# Patient Record
Sex: Male | Born: 1955 | Race: Black or African American | Hispanic: No | Marital: Married | Smoking: Never smoker
Health system: Southern US, Community
[De-identification: ages and names within clinical notes are randomized; demographics above are authoritative.]

---

## 2004-05-01 ENCOUNTER — Encounter: Admission: RE | Admit: 2004-05-01 | Discharge: 2004-05-01 | Payer: Self-pay | Admitting: Internal Medicine

## 2004-08-21 ENCOUNTER — Encounter: Admission: RE | Admit: 2004-08-21 | Discharge: 2004-09-05 | Payer: Self-pay | Admitting: Internal Medicine

## 2016-01-03 DIAGNOSIS — R739 Hyperglycemia, unspecified: Secondary | ICD-10-CM | POA: Diagnosis not present

## 2016-01-03 DIAGNOSIS — Z1389 Encounter for screening for other disorder: Secondary | ICD-10-CM | POA: Diagnosis not present

## 2016-01-03 DIAGNOSIS — R7303 Prediabetes: Secondary | ICD-10-CM | POA: Diagnosis not present

## 2016-01-03 DIAGNOSIS — Z0000 Encounter for general adult medical examination without abnormal findings: Secondary | ICD-10-CM | POA: Diagnosis not present

## 2016-01-08 DIAGNOSIS — H6123 Impacted cerumen, bilateral: Secondary | ICD-10-CM | POA: Diagnosis not present

## 2016-07-02 DIAGNOSIS — I10 Essential (primary) hypertension: Secondary | ICD-10-CM | POA: Diagnosis not present

## 2016-07-02 DIAGNOSIS — E7800 Pure hypercholesterolemia, unspecified: Secondary | ICD-10-CM | POA: Diagnosis not present

## 2016-07-02 DIAGNOSIS — K219 Gastro-esophageal reflux disease without esophagitis: Secondary | ICD-10-CM | POA: Diagnosis not present

## 2017-03-16 DIAGNOSIS — K219 Gastro-esophageal reflux disease without esophagitis: Secondary | ICD-10-CM | POA: Diagnosis not present

## 2017-03-16 DIAGNOSIS — Z1159 Encounter for screening for other viral diseases: Secondary | ICD-10-CM | POA: Diagnosis not present

## 2017-03-16 DIAGNOSIS — Z125 Encounter for screening for malignant neoplasm of prostate: Secondary | ICD-10-CM | POA: Diagnosis not present

## 2017-03-16 DIAGNOSIS — Z0000 Encounter for general adult medical examination without abnormal findings: Secondary | ICD-10-CM | POA: Diagnosis not present

## 2017-03-16 DIAGNOSIS — I10 Essential (primary) hypertension: Secondary | ICD-10-CM | POA: Diagnosis not present

## 2017-03-16 DIAGNOSIS — R7301 Impaired fasting glucose: Secondary | ICD-10-CM | POA: Diagnosis not present

## 2017-03-16 DIAGNOSIS — E7800 Pure hypercholesterolemia, unspecified: Secondary | ICD-10-CM | POA: Diagnosis not present

## 2017-04-02 DIAGNOSIS — H524 Presbyopia: Secondary | ICD-10-CM | POA: Diagnosis not present

## 2017-04-15 DIAGNOSIS — E876 Hypokalemia: Secondary | ICD-10-CM | POA: Diagnosis not present

## 2017-05-06 DIAGNOSIS — Z1210 Encounter for screening for malignant neoplasm of intestinal tract, unspecified: Secondary | ICD-10-CM | POA: Diagnosis not present

## 2017-06-12 DIAGNOSIS — D126 Benign neoplasm of colon, unspecified: Secondary | ICD-10-CM | POA: Diagnosis not present

## 2017-06-12 DIAGNOSIS — Z1211 Encounter for screening for malignant neoplasm of colon: Secondary | ICD-10-CM | POA: Diagnosis not present

## 2017-06-18 DIAGNOSIS — D126 Benign neoplasm of colon, unspecified: Secondary | ICD-10-CM | POA: Diagnosis not present

## 2017-09-17 DIAGNOSIS — I10 Essential (primary) hypertension: Secondary | ICD-10-CM | POA: Diagnosis not present

## 2017-09-17 DIAGNOSIS — Z1389 Encounter for screening for other disorder: Secondary | ICD-10-CM | POA: Diagnosis not present

## 2017-09-17 DIAGNOSIS — E7800 Pure hypercholesterolemia, unspecified: Secondary | ICD-10-CM | POA: Diagnosis not present

## 2017-09-17 DIAGNOSIS — R7303 Prediabetes: Secondary | ICD-10-CM | POA: Diagnosis not present

## 2018-01-14 ENCOUNTER — Other Ambulatory Visit: Payer: Self-pay

## 2018-01-14 ENCOUNTER — Ambulatory Visit (INDEPENDENT_AMBULATORY_CARE_PROVIDER_SITE_OTHER): Payer: BLUE CROSS/BLUE SHIELD

## 2018-01-14 ENCOUNTER — Encounter: Payer: Self-pay | Admitting: Podiatry

## 2018-01-14 ENCOUNTER — Other Ambulatory Visit: Payer: Self-pay | Admitting: Podiatry

## 2018-01-14 ENCOUNTER — Ambulatory Visit: Payer: BLUE CROSS/BLUE SHIELD | Admitting: Podiatry

## 2018-01-14 VITALS — BP 172/98 | HR 70

## 2018-01-14 DIAGNOSIS — M79671 Pain in right foot: Secondary | ICD-10-CM

## 2018-01-14 DIAGNOSIS — M779 Enthesopathy, unspecified: Secondary | ICD-10-CM

## 2018-01-14 DIAGNOSIS — M79672 Pain in left foot: Principal | ICD-10-CM

## 2018-01-14 DIAGNOSIS — M7752 Other enthesopathy of left foot: Secondary | ICD-10-CM | POA: Diagnosis not present

## 2018-01-14 DIAGNOSIS — M7751 Other enthesopathy of right foot: Secondary | ICD-10-CM | POA: Diagnosis not present

## 2018-01-14 MED ORDER — TRIAMCINOLONE ACETONIDE 10 MG/ML IJ SUSP
10.0000 mg | Freq: Once | INTRAMUSCULAR | Status: AC
  Administered 2018-01-14: 10 mg

## 2018-01-14 MED ORDER — DICLOFENAC SODIUM 75 MG PO TBEC: 75.0000 mg | DELAYED_RELEASE_TABLET | Freq: Two times a day (BID) | ORAL | 2 refills | Status: AC

## 2018-01-14 NOTE — Patient Instructions (Signed)

## 2018-01-15 NOTE — Progress Notes (Signed)
Subjective:   Patient ID: Christopher Barnes, male   DOB: 62 y.o.   MRN: [id number]   HPI Patient presents stating he has had a lot of pain on the inside of the left ankle for the last several months and does not remember specific injury.  Patient states that he does walk on cement floors and is on his foot all day and also patient does not smoke and likes to be active   Review of Systems  All other systems reviewed and are negative.       Objective:  Physical Exam  Constitutional: He appears well-developed and well-nourished.  Cardiovascular: Intact distal pulses.  Pulmonary/Chest: Effort normal.  Musculoskeletal: Normal range of motion.  Neurological: He is alert.  Skin: Skin is warm.  Nursing note and vitals reviewed.   Neurovascular status found to be intact muscle strength is adequate range of motion within normal limits with patient found to have quite a bit of discomfort in the medial ankle left with inflammation fluid around the posterior tibial insertion left.  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  At this point I went ahead and I did a sheath injection left 3 mg Kenalog 5 mg Xylocaine and I applied a fascial brace to lift the arch and discussed long-term orthotics.  I placed on diclofenac 75 mg twice daily and patient will be seen back to recheck  X-ray indicates moderate depression of the arch with no indications of pathology

## 2018-01-21 ENCOUNTER — Ambulatory Visit: Payer: BLUE CROSS/BLUE SHIELD | Admitting: Podiatry

## 2018-01-21 ENCOUNTER — Encounter: Payer: Self-pay | Admitting: Podiatry

## 2018-01-21 DIAGNOSIS — M79672 Pain in left foot: Secondary | ICD-10-CM | POA: Diagnosis not present

## 2018-01-21 DIAGNOSIS — M779 Enthesopathy, unspecified: Secondary | ICD-10-CM

## 2018-01-21 DIAGNOSIS — M79671 Pain in right foot: Secondary | ICD-10-CM

## 2018-01-24 NOTE — Progress Notes (Signed)
Subjective:   Patient ID: Christopher Barnes, male   DOB: 62 y.o.   MRN: [id number]   HPI Patient presents with chronic discomfort in the left plantar fascia and into the posterior tibial tendon and states it is improved but present with moderate depression of the arch and has had orthotics in the past which have lost their ability to hold his arch properly   ROS      Objective:  Physical Exam  Neurovascular status intact with continued inflammation of the posterior tibial tendon insertion left with improvement from previous visit and significant depression of the arch noted bilateral     Assessment:  Inflammatory condition with structural changes of the arch contributory to the problem     Plan:  H&P condition reviewed and at this point recommended orthotics to lift up the arch and take pressure off along with ice therapy and supportive shoes.  Patient is casted for functional orthotics and we will lift up the medial side and will be seen by ped orthotist for dispensing

## 2018-02-07 DIAGNOSIS — B001 Herpesviral vesicular dermatitis: Secondary | ICD-10-CM | POA: Diagnosis not present

## 2018-02-07 DIAGNOSIS — I10 Essential (primary) hypertension: Secondary | ICD-10-CM | POA: Diagnosis not present

## 2018-02-16 ENCOUNTER — Ambulatory Visit: Payer: BLUE CROSS/BLUE SHIELD | Admitting: Orthotics

## 2018-02-16 DIAGNOSIS — M779 Enthesopathy, unspecified: Secondary | ICD-10-CM

## 2018-02-26 NOTE — Progress Notes (Signed)
Patient came in today to pick up custom made foot orthotics.  The goals were accomplished and the patient reported no dissatisfaction with said orthotics.  Patient was advised of breakin period and how to report any issues. 

## 2018-03-24 DIAGNOSIS — Z0000 Encounter for general adult medical examination without abnormal findings: Secondary | ICD-10-CM | POA: Diagnosis not present

## 2018-03-24 DIAGNOSIS — Z1389 Encounter for screening for other disorder: Secondary | ICD-10-CM | POA: Diagnosis not present

## 2018-03-24 DIAGNOSIS — I10 Essential (primary) hypertension: Secondary | ICD-10-CM | POA: Diagnosis not present

## 2018-03-24 DIAGNOSIS — R7303 Prediabetes: Secondary | ICD-10-CM | POA: Diagnosis not present

## 2018-03-24 DIAGNOSIS — E7800 Pure hypercholesterolemia, unspecified: Secondary | ICD-10-CM | POA: Diagnosis not present

## 2018-09-28 DIAGNOSIS — E7800 Pure hypercholesterolemia, unspecified: Secondary | ICD-10-CM | POA: Diagnosis not present

## 2018-09-28 DIAGNOSIS — E876 Hypokalemia: Secondary | ICD-10-CM | POA: Diagnosis not present

## 2018-09-28 DIAGNOSIS — R7303 Prediabetes: Secondary | ICD-10-CM | POA: Diagnosis not present

## 2018-09-28 DIAGNOSIS — I10 Essential (primary) hypertension: Secondary | ICD-10-CM | POA: Diagnosis not present

## 2019-01-25 DIAGNOSIS — H524 Presbyopia: Secondary | ICD-10-CM | POA: Diagnosis not present

## 2019-02-14 DIAGNOSIS — H43812 Vitreous degeneration, left eye: Secondary | ICD-10-CM | POA: Diagnosis not present

## 2019-03-30 DIAGNOSIS — R7303 Prediabetes: Secondary | ICD-10-CM | POA: Diagnosis not present

## 2019-03-30 DIAGNOSIS — Z0000 Encounter for general adult medical examination without abnormal findings: Secondary | ICD-10-CM | POA: Diagnosis not present

## 2019-03-30 DIAGNOSIS — Z125 Encounter for screening for malignant neoplasm of prostate: Secondary | ICD-10-CM | POA: Diagnosis not present

## 2019-03-30 DIAGNOSIS — Z1389 Encounter for screening for other disorder: Secondary | ICD-10-CM | POA: Diagnosis not present

## 2019-03-30 DIAGNOSIS — Z23 Encounter for immunization: Secondary | ICD-10-CM | POA: Diagnosis not present

## 2019-03-30 DIAGNOSIS — E7800 Pure hypercholesterolemia, unspecified: Secondary | ICD-10-CM | POA: Diagnosis not present

## 2019-03-30 DIAGNOSIS — I10 Essential (primary) hypertension: Secondary | ICD-10-CM | POA: Diagnosis not present

## 2019-04-01 DIAGNOSIS — H43812 Vitreous degeneration, left eye: Secondary | ICD-10-CM | POA: Diagnosis not present

## 2019-04-26 ENCOUNTER — Emergency Department (HOSPITAL_COMMUNITY): Payer: BC Managed Care – PPO

## 2019-04-26 ENCOUNTER — Emergency Department (HOSPITAL_COMMUNITY)
Admission: EM | Admit: 2019-04-26 | Discharge: 2019-04-26 | Disposition: A | Discharge status: Home / Self Care | Payer: BC Managed Care – PPO | Attending: Emergency Medicine | Admitting: Emergency Medicine

## 2019-04-26 ENCOUNTER — Other Ambulatory Visit: Payer: Self-pay

## 2019-04-26 DIAGNOSIS — E237 Disorder of pituitary gland, unspecified: Secondary | ICD-10-CM | POA: Diagnosis not present

## 2019-04-26 DIAGNOSIS — M542 Cervicalgia: Secondary | ICD-10-CM | POA: Diagnosis not present

## 2019-04-26 DIAGNOSIS — E236 Other disorders of pituitary gland: Secondary | ICD-10-CM | POA: Diagnosis not present

## 2019-04-26 DIAGNOSIS — R51 Headache: Secondary | ICD-10-CM | POA: Diagnosis not present

## 2019-04-26 DIAGNOSIS — R519 Headache, unspecified: Secondary | ICD-10-CM

## 2019-04-26 DIAGNOSIS — Z79899 Other long term (current) drug therapy: Secondary | ICD-10-CM | POA: Insufficient documentation

## 2019-04-26 LAB — CBC WITH DIFFERENTIAL/PLATELET
Abs Immature Granulocytes: 0.01 10*3/uL (ref 0.00–0.07)
Basophils Absolute: 0 10*3/uL (ref 0.0–0.1)
Basophils Relative: 1 %
Eosinophils Absolute: 0.2 10*3/uL (ref 0.0–0.5)
Eosinophils Relative: 4 %
HCT: 49.5 % (ref 39.0–52.0)
Hemoglobin: 16 g/dL (ref 13.0–17.0)
Immature Granulocytes: 0 %
Lymphocytes Relative: 14 %
Lymphs Abs: 0.7 10*3/uL (ref 0.7–4.0)
MCH: 27.9 pg (ref 26.0–34.0)
MCHC: 32.3 g/dL (ref 30.0–36.0)
MCV: 86.2 fL (ref 80.0–100.0)
Monocytes Absolute: 0.2 10*3/uL (ref 0.1–1.0)
Monocytes Relative: 4 %
Neutro Abs: 3.8 10*3/uL (ref 1.7–7.7)
Neutrophils Relative %: 77 %
Platelets: 314 10*3/uL (ref 150–400)
RBC: 5.74 MIL/uL (ref 4.22–5.81)
RDW: 13.1 % (ref 11.5–15.5)
WBC: 4.9 10*3/uL (ref 4.0–10.5)
nRBC: 0 % (ref 0.0–0.2)

## 2019-04-26 LAB — COMPREHENSIVE METABOLIC PANEL
ALT: 40 U/L (ref 0–44)
AST: 39 U/L (ref 15–41)
Albumin: 4.4 g/dL (ref 3.5–5.0)
Alkaline Phosphatase: 66 U/L (ref 38–126)
Anion gap: 12 (ref 5–15)
BUN: 18 mg/dL (ref 8–23)
CO2: 19 mmol/L — ABNORMAL LOW (ref 22–32)
Calcium: 9.5 mg/dL (ref 8.9–10.3)
Chloride: 112 mmol/L — ABNORMAL HIGH (ref 98–111)
Creatinine, Ser: 1.09 mg/dL (ref 0.61–1.24)
GFR calc Af Amer: >60 mL/min (ref >60)
GFR calc non Af Amer: >60 mL/min (ref >60)
Glucose, Bld: 132 mg/dL — ABNORMAL HIGH (ref 70–99)
Potassium: 3.7 mmol/L (ref 3.5–5.1)
Sodium: 143 mmol/L (ref 135–145)
Total Bilirubin: 0.7 mg/dL (ref 0.3–1.2)
Total Protein: 7.8 g/dL (ref 6.5–8.1)

## 2019-04-26 LAB — PROTIME-INR
INR: 1 (ref 0.8–1.2)
Prothrombin Time: 13.2 seconds (ref 11.4–15.2)

## 2019-04-26 MED ORDER — PROCHLORPERAZINE EDISYLATE 10 MG/2ML IJ SOLN
10.0000 mg | Freq: Once | INTRAMUSCULAR | Status: AC
  Administered 2019-04-26: 10 mg via INTRAVENOUS
  Filled 2019-04-26: qty 2

## 2019-04-26 MED ORDER — SODIUM CHLORIDE 0.9 % IV BOLUS
1000.0000 mL | Freq: Once | INTRAVENOUS | Status: AC
  Administered 2019-04-26: 1000 mL via INTRAVENOUS

## 2019-04-26 MED ORDER — IOHEXOL 350 MG/ML SOLN
75.0000 mL | Freq: Once | INTRAVENOUS | Status: AC | PRN
  Administered 2019-04-26: 10:00:00 75 mL via INTRAVENOUS

## 2019-04-26 MED ORDER — DIPHENHYDRAMINE HCL 50 MG/ML IJ SOLN
25.0000 mg | Freq: Once | INTRAMUSCULAR | Status: AC
Start: 2019-04-26 — End: 2019-04-26
  Administered 2019-04-26: 25 mg via INTRAVENOUS
  Filled 2019-04-26: qty 1

## 2019-04-26 MED ORDER — GADOBUTROL 1 MMOL/ML IV SOLN
7.0000 mL | Freq: Once | INTRAVENOUS | Status: AC | PRN
  Administered 2019-04-26: 7 mL via INTRAVENOUS

## 2019-04-26 MED ORDER — CYCLOBENZAPRINE HCL 10 MG PO TABS: 10.0000 mg | ORAL_TABLET | Freq: Two times a day (BID) | ORAL | 0 refills | Status: AC | PRN

## 2019-04-26 NOTE — ED Triage Notes (Signed)
Patient reports headache onset this morning, no relief with ibuprofen. Denies N/V, vision changes, photosensitivity. NAD noted.

## 2019-04-26 NOTE — Discharge Instructions (Signed)
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  I do not think it was the cause of your headache today, I suspect it was more related to muscle pain and spasm in your neck however, as we discovered, neurosurgery request follow-up with you in clinic.  Please stay hydrated and rest.  If any symptoms change or worsen acutely including developing new neurologic deficits, please return to the nearest emergency department.

## 2019-04-26 NOTE — ED Provider Notes (Signed)
[facility] EMERGENCY DEPARTMENT Provider Note   CSN: [id number] Arrival date & time: 04/26/19  [id number]     History   Chief Complaint Chief Complaint  Patient presents with   Headache    HPI Christopher Barnes is a 63 y.o. male.     The history is provided by medical records, the patient and the spouse. No language interpreter was used.  Headache Pain location:  R parietal and occipital Quality:  Dull Radiates to:  R neck Severity currently:  8/10 Severity at highest:  10/10 Onset quality:  Gradual Duration:  5 hours Timing:  Constant Progression:  Waxing and waning Chronicity:  New Similar to prior headaches: no   Context: loud noise   Relieved by:  Nothing Worsened by:  Nothing Ineffective treatments:  NSAIDs Associated symptoms: neck pain   Associated symptoms: no abdominal pain, no back pain, no blurred vision, no congestion, no cough, no diarrhea, no dizziness, no eye pain, no fatigue, no fever, no focal weakness, no hearing loss, no loss of balance, no nausea, no near-syncope, no neck stiffness, no numbness, no photophobia, no seizures, no URI, no visual change, no vomiting and no weakness     No past medical history on file.  There are no active problems to display for this patient.   No past surgical history on file.      Home Medications    Prior to Admission medications   Medication Sig Start Date End Date Taking? Authorizing Provider  diclofenac (VOLTAREN) 75 MG EC tablet Take 1 tablet (75 mg total) by mouth 2 (two) times daily. 01/14/18   Wallene Huh, DPM  diltiazem (TIAZAC) 240 MG 24 hr capsule TK 1 C PO TWICE A DAY 12/01/17   [provider]  fosinopril-hydrochlorothiazide (MONOPRIL-HCT) 10-12.5 MG tablet TK 1 T PO D ONCE A DAY 12/01/17   [provider]  Potassium Chloride CR (MICRO-K) 8 MEQ CPCR capsule CR Take 20 mEq by mouth.    [provider]  simvastatin (ZOCOR) 20 MG tablet TK 1 T PO IN THE EVENING  ONCE A DAY 12/01/17   [provider]  triamcinolone cream (KENALOG) 0.1 % APPLY SPARINGLY TWICE DAILY 10/29/17   [provider]    Family History No family history on file.  Social History Social History   Tobacco Use   Smoking status: Never Smoker   Smokeless tobacco: Never Used  Substance Use Topics   Alcohol use: Not on file   Drug use: Not on file     Allergies   Patient has no known allergies.   Review of Systems Review of Systems  Constitutional: Negative for chills, diaphoresis, fatigue and fever.  HENT: Negative for congestion and hearing loss.   Eyes: Negative for blurred vision, photophobia, pain and visual disturbance.  Respiratory: Negative for cough, chest tightness, shortness of breath, wheezing and stridor.   Cardiovascular: Negative for chest pain, palpitations, leg swelling and near-syncope.  Gastrointestinal: Negative for abdominal pain, constipation, diarrhea, nausea and vomiting.  Genitourinary: Negative for flank pain and frequency.  Musculoskeletal: Positive for neck pain. Negative for back pain and neck stiffness.  Skin: Negative for rash and wound.  Neurological: Positive for headaches. Negative for dizziness, focal weakness, seizures, facial asymmetry, weakness, light-headedness, numbness and loss of balance.  Psychiatric/Behavioral: Negative for agitation and confusion.  All other systems reviewed and are negative.    Physical Exam Updated Vital Signs BP (!) 145/73 (BP Location: Right Arm)  Pulse 62    Temp 98.2 F (36.8 C) (Oral)    Resp 16    SpO2 98%   Physical Exam Vitals signs and nursing note reviewed.  Constitutional:      General: He is not in acute distress.    Appearance: He is well-developed. He is not ill-appearing, toxic-appearing or diaphoretic.  HENT:     Head: Normocephalic and atraumatic.  Eyes:     General: No visual field deficit or scleral icterus.    Extraocular Movements: Extraocular  movements intact.     Conjunctiva/sclera: Conjunctivae normal.     Pupils: Pupils are equal, round, and reactive to light. Pupils are equal.  Neck:     Musculoskeletal: Normal range of motion and neck supple. No neck rigidity.  Cardiovascular:     Rate and Rhythm: Normal rate and regular rhythm.     Heart sounds: Normal heart sounds. No murmur.  Pulmonary:     Effort: Pulmonary effort is normal. No respiratory distress.     Breath sounds: Normal breath sounds.  Abdominal:     Palpations: Abdomen is soft.     Tenderness: There is no abdominal tenderness.  Musculoskeletal: Normal range of motion.        General: No swelling or tenderness.  Skin:    General: Skin is warm and dry.     Capillary Refill: Capillary refill takes less than 2 seconds.  Neurological:     Mental Status: He is alert and oriented to person, place, and time.     GCS: GCS eye subscore is 4. GCS verbal subscore is 5. GCS motor subscore is 6.     Cranial Nerves: No cranial nerve deficit, dysarthria or facial asymmetry.     Sensory: No sensory deficit.     Motor: No weakness.     Coordination: Romberg sign negative. Coordination normal.     Gait: Gait normal.     Deep Tendon Reflexes: Reflexes normal.  Psychiatric:        Mood and Affect: Mood normal.      ED Treatments / Results  Labs (all labs ordered are listed, but only abnormal results are displayed) Labs Reviewed  COMPREHENSIVE METABOLIC PANEL - Abnormal; Notable for the following components:      Result Value   Chloride 112 (*)    CO2 19 (*)    Glucose, Bld 132 (*)    All other components within normal limits  CBC WITH DIFFERENTIAL/PLATELET  PROTIME-INR    EKG None  Radiology Ct Angio Head W Or Wo Contrast  Result Date: 04/26/2019 CLINICAL DATA:  Sudden onset new type headache. Right neck pain. Rule out dissection. EXAM: CT ANGIOGRAPHY HEAD AND NECK TECHNIQUE: Multidetector CT imaging of the head and neck was performed using the standard  protocol during bolus administration of intravenous contrast. Multiplanar CT image reconstructions and MIPs were obtained to evaluate the vascular anatomy. Carotid stenosis measurements (when applicable) are obtained utilizing NASCET criteria, using the distal internal carotid diameter as the denominator. CONTRAST:  64mL OMNIPAQUE IOHEXOL 350 MG/ML SOLN COMPARISON:  None. FINDINGS: CT HEAD FINDINGS Brain: The sella is enlarged. Pituitary mass is present measuring 15 mm in height. No definite hemorrhage in the pituitary. Probable mild compression of the optic chiasm Ventricle size normal. Negative for acute or chronic infarct. No acute hemorrhage. Vascular: Negative for hyperdense vessel Skull: Negative Sinuses: Negative Orbits: Negative Review of the MIP images confirms the above findings CTA NECK FINDINGS Aortic arch: Standard branching. Imaged portion shows  no evidence of aneurysm or dissection. No significant stenosis of the major arch vessel origins. Right carotid system: Normal right carotid. Negative for stenosis or dissection Left carotid system: Normal left carotid. Negative for stenosis or dissection. Vertebral arteries: Both vertebral arteries are normal and patent to the basilar. Skeleton: Negative Other neck: None Upper chest: Lung apices clear bilaterally. Review of the MIP images confirms the above findings CTA HEAD FINDINGS Anterior circulation: Cavernous carotid widely patent. Anterior and middle cerebral arteries widely patent without stenosis. Negative for aneurysm Posterior circulation: Both vertebral arteries patent to the basilar. Right PICA patent. Left PICA not visualized left AICA may supply this territory. Basilar widely patent. Superior cerebellar and posterior cerebral arteries patent bilaterally without stenosis. Negative for aneurysm. Venous sinuses: Normal venous enhancement Anatomic variants: None Review of the MIP images confirms the above findings IMPRESSION: 1. 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 Suprasellar extension with possible mild compression of the optic chiasm. No evidence of pituitary hemorrhage. Recommend follow-up MRI brain without and with contrast to further evaluate. 2. Negative for acute infarct 3. Negative CTA head neck. No dissection or aneurysm identified. No significant arterial stenosis. Electronically Signed   By: Franchot Gallo M.D.   On: 04/26/2019 10:08   Ct Angio Neck W And/or Wo Contrast  Result Date: 04/26/2019 CLINICAL DATA:  Sudden onset new type headache. Right neck pain. Rule out dissection. EXAM: CT ANGIOGRAPHY HEAD AND NECK TECHNIQUE: Multidetector CT imaging of the head and neck was performed using the standard protocol during bolus administration of intravenous contrast. Multiplanar CT image reconstructions and MIPs were obtained to evaluate the vascular anatomy. Carotid stenosis measurements (when applicable) are obtained utilizing NASCET criteria, using the distal internal carotid diameter as the denominator. CONTRAST:  70mL OMNIPAQUE IOHEXOL 350 MG/ML SOLN COMPARISON:  None. FINDINGS: CT HEAD FINDINGS Brain: The sella is enlarged. Pituitary mass is present measuring 15 mm in height. No definite hemorrhage in the pituitary. Probable mild compression of the optic chiasm Ventricle size normal. Negative for acute or chronic infarct. No acute hemorrhage. Vascular: Negative for hyperdense vessel Skull: Negative Sinuses: Negative Orbits: Negative Review of the MIP images confirms the above findings CTA NECK FINDINGS Aortic arch: Standard branching. Imaged portion shows no evidence of aneurysm or dissection. No significant stenosis of the major arch vessel origins. Right carotid system: Normal right carotid. Negative for stenosis or dissection Left carotid system: Normal left carotid. Negative for stenosis or dissection. Vertebral arteries: Both vertebral arteries are normal and patent to the basilar. Skeleton: Negative Other neck: None Upper  chest: Lung apices clear bilaterally. Review of the MIP images confirms the above findings CTA HEAD FINDINGS Anterior circulation: Cavernous carotid widely patent. Anterior and middle cerebral arteries widely patent without stenosis. Negative for aneurysm Posterior circulation: Both vertebral arteries patent to the basilar. Right PICA patent. Left PICA not visualized left AICA may supply this territory. Basilar widely patent. Superior cerebellar and posterior cerebral arteries patent bilaterally without stenosis. Negative for aneurysm. Venous sinuses: Normal venous enhancement Anatomic variants: None Review of the MIP images confirms the above findings IMPRESSION: 1. 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 Suprasellar extension with possible mild compression of the optic chiasm. No evidence of pituitary hemorrhage. Recommend follow-up MRI brain without and with contrast to further evaluate. 2. Negative for acute infarct 3. Negative CTA head neck. No dissection or aneurysm identified. No significant arterial stenosis. Electronically Signed   By: Franchot Gallo M.D.   On: 04/26/2019 10:08  Mr Jeri Cos And Wo Contrast  Result Date: 04/26/2019 CLINICAL DATA:  Severe headache.  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 EXAM: MRI HEAD WITHOUT AND WITH CONTRAST TECHNIQUE: Multiplanar, multiecho pulse sequences of the brain and surrounding structures were obtained without and with intravenous contrast. CONTRAST:  20mL GADAVIST GADOBUTROL 1 MMOL/ML IV SOLN COMPARISON:  CTA head 04/26/2019 FINDINGS: Brain: Pituitary protocol. 15 mm mass within the sella. The sella is enlarged. The mass does not enhance on dynamic or delayed scanning. The mass is isointense to brain. No evidence of hemorrhage in the mass. The mass extends into the suprasellar cistern with superior displacement of the optic chiasm. Mild extension into the right cavernous sinus. Ventricle size normal. No acute infarct. No significant chronic ischemia or hemorrhage  Motion degraded study particularly on the postcontrast images Vascular: Normal arterial flow voids Skull and upper cervical spine: Negative Sinuses/Orbits: Mild mucosal edema paranasal sinuses.  Normal orbit Other: None IMPRESSION: 15 mm nonenhancing mass in the sella. Mass extends into the suprasellar cistern with mild flattening and up lifting of the optic chiasm. Mild extension into the right cavernous sinus. No evidence of hemorrhage into the mass. 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 Rathke's cleft cyst also possible. Craniopharyngioma not considered likely due to lack of enhancement and lack of calcification. Otherwise negative brain. Electronically Signed   By: Franchot Gallo M.D.   On: 04/26/2019 13:04    Procedures Procedures (including critical care time)  Medications Ordered in ED Medications  sodium chloride 0.9 % bolus 1,000 mL (0 mLs Intravenous Stopped 04/26/19 1212)  prochlorperazine (COMPAZINE) injection 10 mg (10 mg Intravenous Given 04/26/19 0833)  diphenhydrAMINE (BENADRYL) injection 25 mg (25 mg Intravenous Given 04/26/19 0833)  iohexol (OMNIPAQUE) 350 MG/ML injection 75 mL (75 mLs Intravenous Contrast Given 04/26/19 0942)  gadobutrol (GADAVIST) 1 MMOL/ML injection 7 mL (7 mLs Intravenous Contrast Given 04/26/19 1150)     Initial Impression / Assessment and Plan / ED Course  I have reviewed the triage vital signs and the nursing notes.  Pertinent labs & imaging results that were available during my care of the patient were reviewed by me and considered in my medical decision making (see chart for details).        SHERAZ SACHAR is a 63 y.o. male with a past medical history sniffing for hypertension who presents with new headache.  He reports that he was working outside yesterday but did not have any headache yesterday.  He reports this morning he woke up around 130 started having headache that intensified throughout the early  morning.  He was unable to go back to sleep.  He describes the pain is from his right lateral neck into his right head.  He describes it as severe between 7 out of 10-10 out of 10 at times.  He denies nausea, vomiting, vision changes.  Denies numbness, tingling, weakness of extremities.  He denies any new vision changes.  He denies any recent traumatic injuries.  No history of dissections, intracranial masses, or intracranial bleeding.  He does not take blood thinners.  He reports he does not have a history of headaches like this and is concerned.  He does report some mild phonophobia but no reported photophobia.  No other complaints on arrival.  No recent fevers, chills, or neck stiffness.  On exam, patient has no nuchal rigidity.  His lungs are clear and chest is nontender.  Abdomen is nontender.  No focal neurologic deficits  on my initial exam.  Good gait.  Normal finger-nose-finger testing bilaterally.  Clear speech.  Pupils are symmetric and reactive with normal visual fields.  Unremarkable neurologic exam.  Mild tenderness in the right lateral neck and head but no temporal tenderness to suggest temporal arteritis.  Had a shared decision-making conversation with patient and family and due to his new headache which he is never had before, the pain going into the neck, and to the right-sided head, we agreed on getting a CT of the head neck to look for dissection but continue to treat him as if he has a more benign headache with a headache cocktail and fluids.  With his lack of fevers, chills, or neck stiffness, low suspicion for meningitis at this time.  If work-up is complete reassuring, dissipate discharge home when headache is resolved.      10:24 AM Patient reports his headache is somewhat improved.  CT returned showing no evidence of dissection or aneurysm but does show evidence of possible pituitary tumor that is 1.5 cm.  MRI with and without contrast recommended further evaluate.  Patient agrees  with getting this imaging today given this new headache.  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  Neurosurgery was called who recommends follow-up in clinic.  No acute interventions needed.  Patient reports headache is improving still has muscle pain and spasm in his right neck.  He would like medication help.  Patient given prescription for muscle relaxant and will follow-up with PCP.  He will also follow-up with neurosurgery.  He understood return precautions and follow-up instructions.  No neurologic deficits and headache improved.  Patient discharged in good condition.     Final Clinical Impressions(s) / ED Diagnoses   Final diagnoses:  Pituitary mass ([place])  Acute nonintractable headache, unspecified headache type  Muscle pain, cervical    ED Discharge Orders         Ordered    cyclobenzaprine (FLEXERIL) 10 MG tablet  2 times daily PRN     04/26/19 1436          Clinical Impression: 1. Pituitary mass ([place])   2. Acute nonintractable headache, unspecified headache type   3. Muscle pain, cervical     Disposition: Discharge  Condition: Good  I have discussed the results, Dx and Tx plan with the pt(& family if present). He/she/they expressed understanding and agree(s) with the plan. Discharge instructions discussed at great length. Strict return precautions discussed and pt &/or family have verbalized understanding of the instructions. No further questions at time of discharge.    Discharge Medication List as of 04/26/2019  2:38 PM    START taking these medications   Details  cyclobenzaprine (FLEXERIL) 10 MG tablet Take 1 tablet (10 mg total) by mouth 2 (two) times daily as needed for muscle spasms., Starting Tue 04/26/2019, Print        Follow Up: Earnie Larsson, MD [address] [place] [address] [phone]   with neurosurgery     Brenda Samano, Gwenyth Allegra, MD 04/26/19 [phone]

## 2019-04-26 NOTE — ED Notes (Signed)
Patient transported to MRI 

## 2019-04-28 DIAGNOSIS — D497 Neoplasm of unspecified behavior of endocrine glands and other parts of nervous system: Secondary | ICD-10-CM | POA: Diagnosis not present

## 2019-04-29 DIAGNOSIS — D352 Benign neoplasm of pituitary gland: Secondary | ICD-10-CM | POA: Diagnosis not present

## 2019-04-29 DIAGNOSIS — D497 Neoplasm of unspecified behavior of endocrine glands and other parts of nervous system: Secondary | ICD-10-CM | POA: Diagnosis not present

## 2019-05-02 DIAGNOSIS — D352 Benign neoplasm of pituitary gland: Secondary | ICD-10-CM | POA: Diagnosis not present

## 2019-05-02 DIAGNOSIS — R51 Headache: Secondary | ICD-10-CM | POA: Diagnosis not present

## 2019-05-05 DIAGNOSIS — D497 Neoplasm of unspecified behavior of endocrine glands and other parts of nervous system: Secondary | ICD-10-CM | POA: Diagnosis not present

## 2019-05-12 DIAGNOSIS — D352 Benign neoplasm of pituitary gland: Secondary | ICD-10-CM | POA: Diagnosis not present

## 2019-05-12 DIAGNOSIS — H5010 Unspecified exotropia: Secondary | ICD-10-CM | POA: Diagnosis not present

## 2019-06-09 DIAGNOSIS — R7989 Other specified abnormal findings of blood chemistry: Secondary | ICD-10-CM | POA: Diagnosis not present

## 2019-06-09 DIAGNOSIS — R350 Frequency of micturition: Secondary | ICD-10-CM | POA: Diagnosis not present

## 2019-06-09 DIAGNOSIS — Z23 Encounter for immunization: Secondary | ICD-10-CM | POA: Diagnosis not present

## 2019-06-21 ENCOUNTER — Other Ambulatory Visit: Payer: Self-pay | Admitting: Neurosurgery

## 2019-06-21 DIAGNOSIS — D497 Neoplasm of unspecified behavior of endocrine glands and other parts of nervous system: Secondary | ICD-10-CM

## 2019-07-06 DIAGNOSIS — D352 Benign neoplasm of pituitary gland: Secondary | ICD-10-CM | POA: Diagnosis not present

## 2019-07-06 DIAGNOSIS — D497 Neoplasm of unspecified behavior of endocrine glands and other parts of nervous system: Secondary | ICD-10-CM | POA: Diagnosis not present

## 2019-08-08 DIAGNOSIS — Z23 Encounter for immunization: Secondary | ICD-10-CM | POA: Diagnosis not present

## 2019-09-20 DIAGNOSIS — D497 Neoplasm of unspecified behavior of endocrine glands and other parts of nervous system: Secondary | ICD-10-CM | POA: Diagnosis not present

## 2019-10-05 DIAGNOSIS — I10 Essential (primary) hypertension: Secondary | ICD-10-CM | POA: Diagnosis not present

## 2019-10-05 DIAGNOSIS — R7303 Prediabetes: Secondary | ICD-10-CM | POA: Diagnosis not present

## 2019-10-05 DIAGNOSIS — K219 Gastro-esophageal reflux disease without esophagitis: Secondary | ICD-10-CM | POA: Diagnosis not present

## 2019-10-05 DIAGNOSIS — E7800 Pure hypercholesterolemia, unspecified: Secondary | ICD-10-CM | POA: Diagnosis not present

## 2019-10-20 DIAGNOSIS — D497 Neoplasm of unspecified behavior of endocrine glands and other parts of nervous system: Secondary | ICD-10-CM | POA: Diagnosis not present

## 2019-12-26 IMAGING — MR MR HEAD WO/W CM
19 of 26 series · 37 of 48 positions shown · IV contrast (Gadavist)
Comparison: CTA head 04/26/2019

CLINICAL DATA: Severe headache.  Pituitary mass on CT.

EXAM:
MRI HEAD WITHOUT AND WITH CONTRAST
TECHNIQUE: Multiplanar, multiecho pulse sequences of the brain and surrounding
structures were obtained without and with intravenous contrast.
CONTRAST:  7mL GADAVIST GADOBUTROL 1 MMOL/ML IV SOLN

[Series 5: DWI · axial · 3.0mm · 0.88mm/px · z∈[-71,+65]mm · 5 of 96 slices shown (1 of 4)]
[im 1/96]
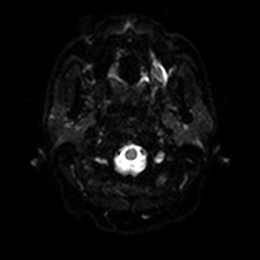
[im 24/96]
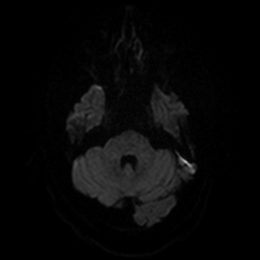
[im 48/96]
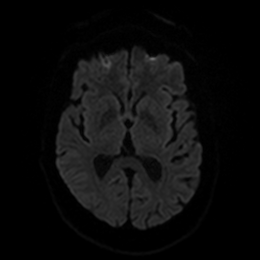
[im 72/96]
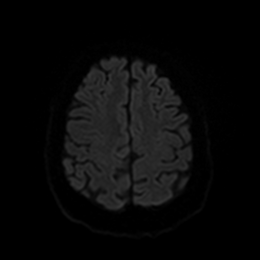
[im 96/96]
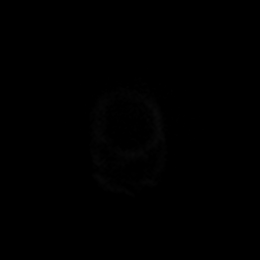

[Series 6: DWI · axial · 3.0mm · 0.88mm/px · z∈[-71,+65]mm · 2 of 48 slices shown (2 of 4)]
[im 1/48]
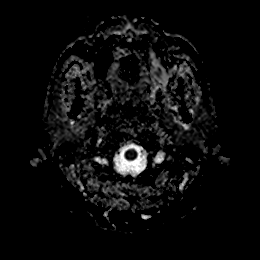
[im 48/48]
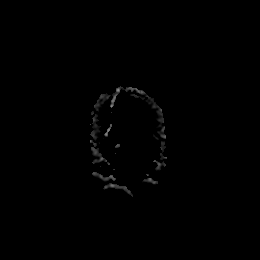

[Series 7: DWI · coronal · 4.0mm · 0.88mm/px · 3 of 64 slices shown (3 of 4)]
[im 1/64]
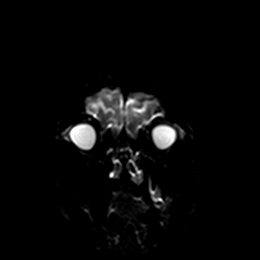
[im 32/64]
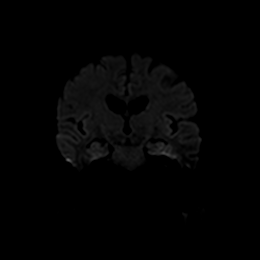
[im 64/64]
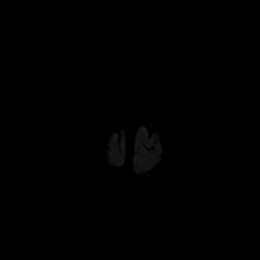

[Series 8: DWI · coronal · 4.0mm · 0.88mm/px · 1 of 32 slices shown (4 of 4)]
[im 1/32]
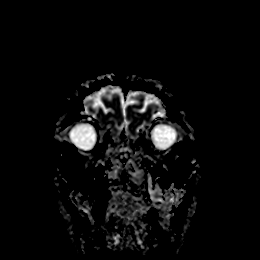

[Series 9: T1 · sagittal · 5.0mm · 0.75mm/px · 1 of 23 slices shown (1 of 3)]
[im 1/23]
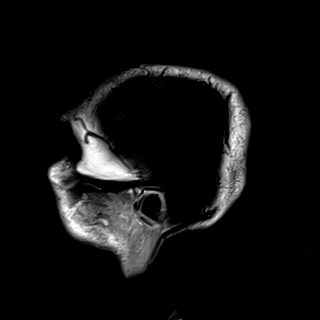

[Series 10: T2 · axial · 3.0mm · 0.72mm/px · 1 of 25 slices shown]
[im 1/25]
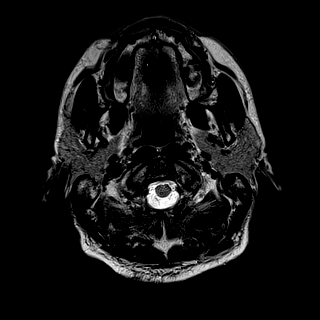

[Series 11: FLAIR · axial · 3.0mm · 0.45mm/px · z∈[-75,+65]mm · 2 of 25 slices shown]
[im 1/25]
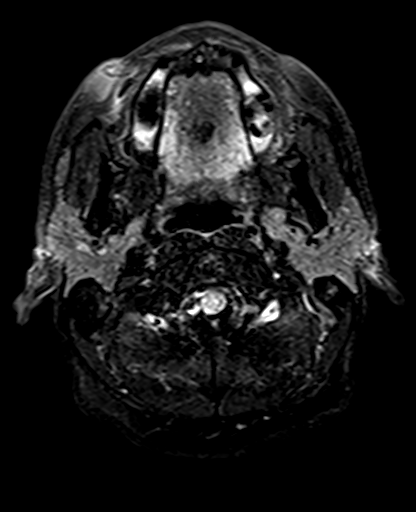
[im 25/25]
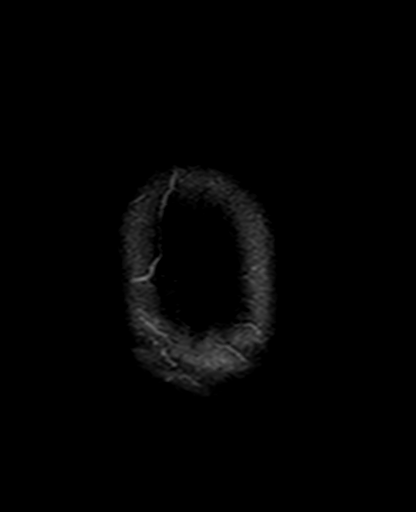

[Series 12: mag_images · axial · 3.0mm · 0.90mm/px · z∈[-77,+71]mm · 3 of 52 slices shown]
[im 1/52]
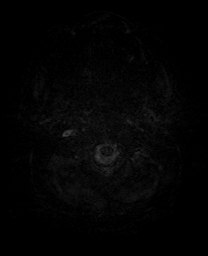
[im 26/52]
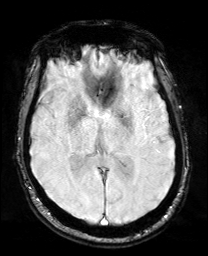
[im 52/52]
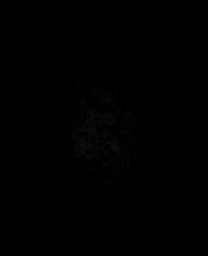

[Series 13: pha_images · axial · 3.0mm · 0.90mm/px · z∈[-77,+62]mm · 3 of 49 slices shown]
[im 1/49]
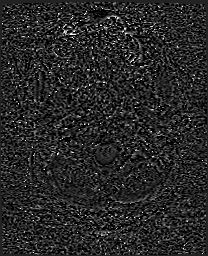
[im 25/49]
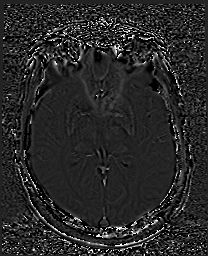
[im 49/49]
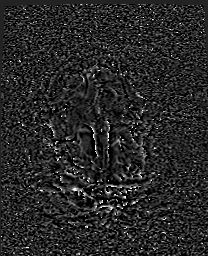

[Series 14: swi_images · axial · 3.0mm · 0.90mm/px · z∈[-77,+71]mm · 3 of 52 slices shown]
[im 1/52]
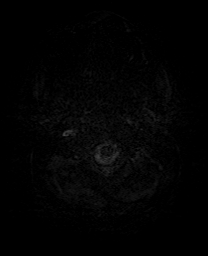
[im 26/52]
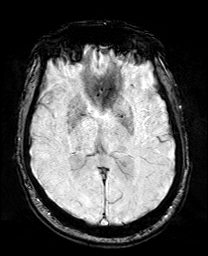
[im 52/52]
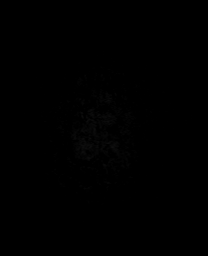

[Series 15: mip_images(sw) · axial · 24.0mm · 0.90mm/px · z∈[-67,+61]mm · 3 of 45 slices shown]
[im 1/45]
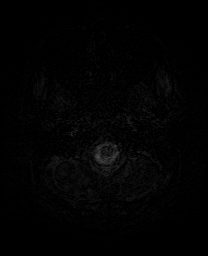
[im 23/45]
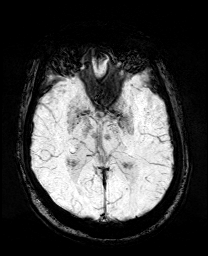
[im 45/45]
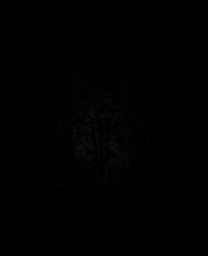

[Series 18: T1 · coronal · 3.0mm · 0.25mm/px · 1 of 13 slices shown (2 of 3)]
[im 1/13]
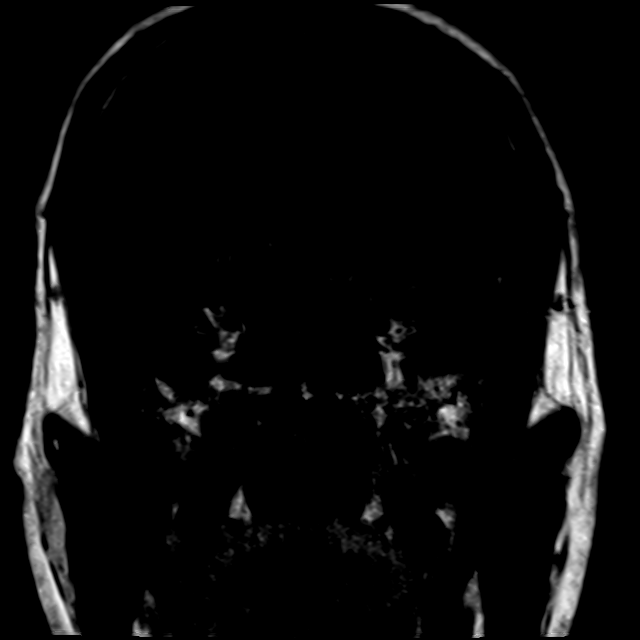

[Series 19: T1 · sagittal · 3.0mm · 0.25mm/px · 1 of 12 slices shown (3 of 3)]
[im 1/12]
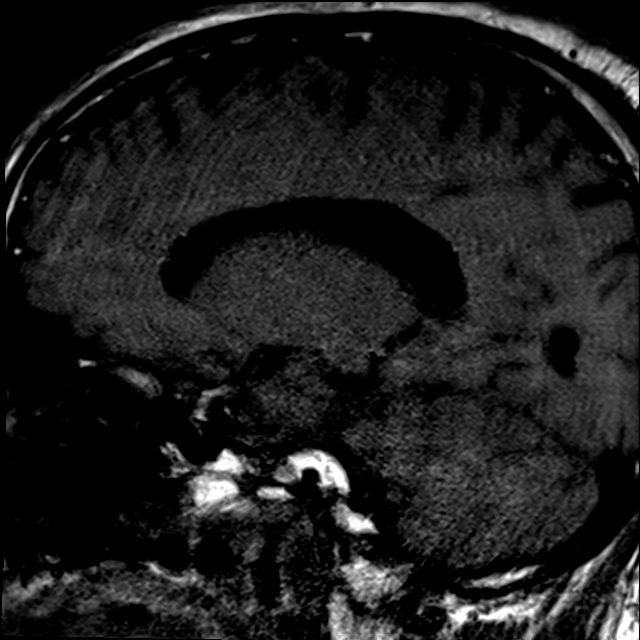

[Series 20: t1_tse_cor_dynamic pre · coronal · non-contrast · 3.0mm · 0.49mm/px · 1 of 5 slices shown]
[im 1/5]
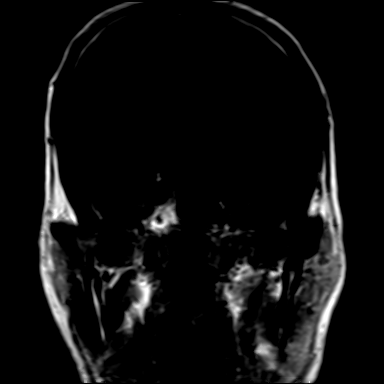

[Series 21: t1_tse_cor_dynamic post · coronal · 3.0mm · 0.49mm/px · 1 of 5 slices shown]
[im 1/5]
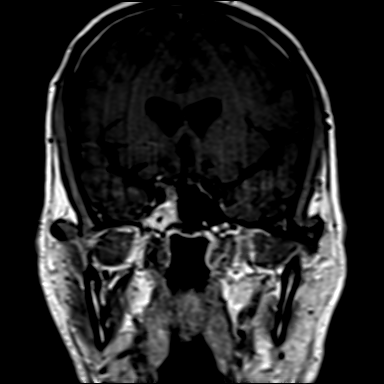

[Series 27: T1 post-contrast · sagittal · 3.0mm · 0.25mm/px · 1 of 12 slices shown (1 of 3)]
[im 1/12]
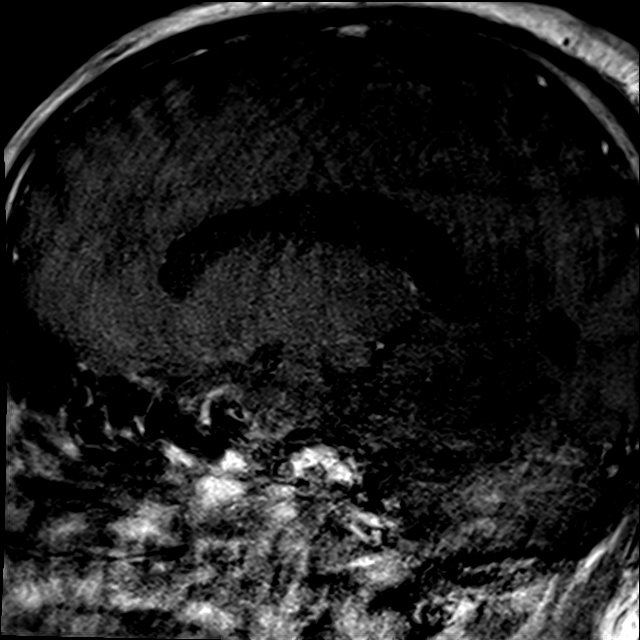

[Series 28: T1 post-contrast · coronal · 3.0mm · 0.25mm/px · 1 of 13 slices shown (2 of 3)]
[im 1/13]
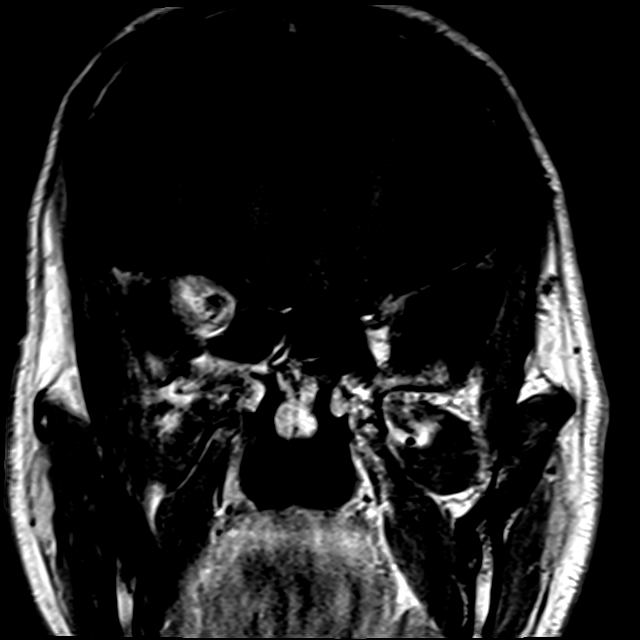

[Series 29: T2 post-contrast · coronal · 5.0mm · 0.72mm/px · 2 of 28 slices shown]
[im 1/28]
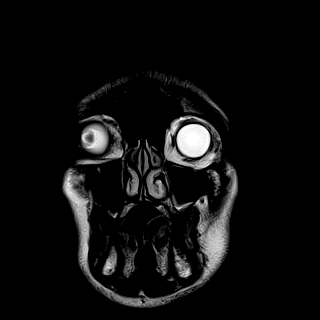
[im 28/28]
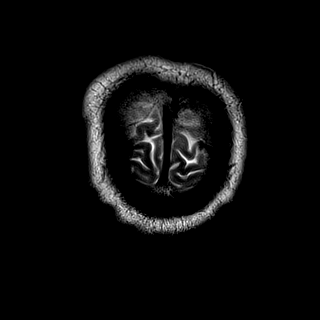

[Series 31: T1 post-contrast · coronal · 5.0mm · 0.34mm/px · 2 of 28 slices shown (3 of 3)]
[im 1/28]
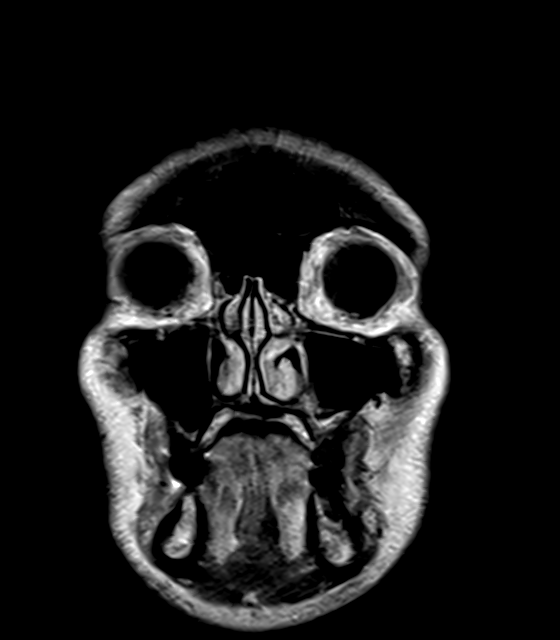
[im 28/28]
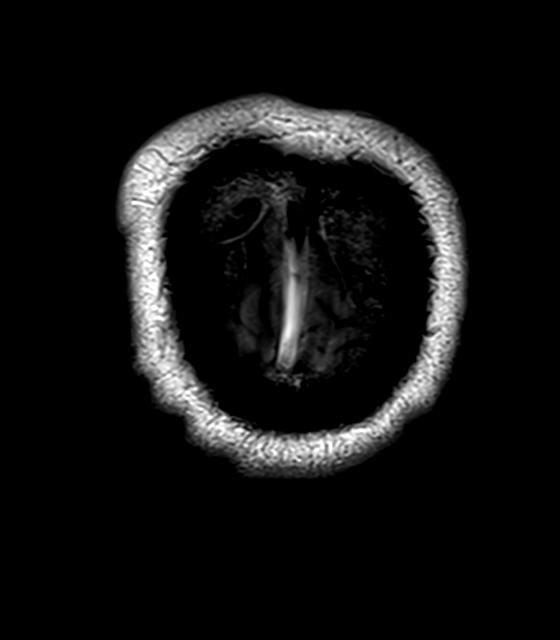

[37 of 48 positions shown; findings below may reference images not displayed]

FINDINGS: Brain: Pituitary protocol. 15 mm mass within the sella. The sella is
enlarged. The mass does not enhance on dynamic or delayed scanning.
The mass is isointense to brain. No evidence of hemorrhage in the
mass. The mass extends into the suprasellar cistern with superior
displacement of the optic chiasm. Mild extension into the right
cavernous sinus.

Ventricle size normal. No acute infarct. No significant chronic
ischemia or hemorrhage

Motion degraded study particularly on the postcontrast images

Vascular: Normal arterial flow voids

Skull and upper cervical spine: Negative

Sinuses/Orbits: Mild mucosal edema paranasal sinuses.  Normal orbit

Other: None
IMPRESSION: 15 mm nonenhancing mass in the sella. Mass extends into the
suprasellar cistern with mild flattening and up lifting of the optic
chiasm. Mild extension into the right cavernous sinus. No evidence
of hemorrhage into the mass. Given the lack of enhancement, this
could represent a cystic pituitary macro adenoma. Rathke's cleft
cyst also possible. Craniopharyngioma not considered likely due to
lack of enhancement and lack of calcification.

Otherwise negative brain.

## 2020-04-17 DIAGNOSIS — Z125 Encounter for screening for malignant neoplasm of prostate: Secondary | ICD-10-CM | POA: Diagnosis not present

## 2020-04-17 DIAGNOSIS — R7303 Prediabetes: Secondary | ICD-10-CM | POA: Diagnosis not present

## 2020-04-17 DIAGNOSIS — I10 Essential (primary) hypertension: Secondary | ICD-10-CM | POA: Diagnosis not present

## 2020-04-17 DIAGNOSIS — Z0000 Encounter for general adult medical examination without abnormal findings: Secondary | ICD-10-CM | POA: Diagnosis not present

## 2020-04-17 DIAGNOSIS — E7800 Pure hypercholesterolemia, unspecified: Secondary | ICD-10-CM | POA: Diagnosis not present

## 2020-05-07 DIAGNOSIS — H6123 Impacted cerumen, bilateral: Secondary | ICD-10-CM | POA: Diagnosis not present

## 2020-05-07 DIAGNOSIS — Z23 Encounter for immunization: Secondary | ICD-10-CM | POA: Diagnosis not present

## 2020-10-15 DIAGNOSIS — R7303 Prediabetes: Secondary | ICD-10-CM | POA: Diagnosis not present

## 2020-10-15 DIAGNOSIS — E7800 Pure hypercholesterolemia, unspecified: Secondary | ICD-10-CM | POA: Diagnosis not present

## 2020-10-15 DIAGNOSIS — I10 Essential (primary) hypertension: Secondary | ICD-10-CM | POA: Diagnosis not present

## 2020-10-15 DIAGNOSIS — D352 Benign neoplasm of pituitary gland: Secondary | ICD-10-CM | POA: Diagnosis not present

## 2020-11-15 DIAGNOSIS — G9389 Other specified disorders of brain: Secondary | ICD-10-CM | POA: Diagnosis not present

## 2020-11-15 DIAGNOSIS — R519 Headache, unspecified: Secondary | ICD-10-CM | POA: Diagnosis not present

## 2020-11-15 DIAGNOSIS — D497 Neoplasm of unspecified behavior of endocrine glands and other parts of nervous system: Secondary | ICD-10-CM | POA: Diagnosis not present

## 2021-04-23 DIAGNOSIS — Z0000 Encounter for general adult medical examination without abnormal findings: Secondary | ICD-10-CM | POA: Diagnosis not present

## 2021-04-23 DIAGNOSIS — R7303 Prediabetes: Secondary | ICD-10-CM | POA: Diagnosis not present

## 2021-04-23 DIAGNOSIS — Z125 Encounter for screening for malignant neoplasm of prostate: Secondary | ICD-10-CM | POA: Diagnosis not present

## 2021-04-23 DIAGNOSIS — E7800 Pure hypercholesterolemia, unspecified: Secondary | ICD-10-CM | POA: Diagnosis not present

## 2021-04-23 DIAGNOSIS — I10 Essential (primary) hypertension: Secondary | ICD-10-CM | POA: Diagnosis not present

## 2021-05-03 ENCOUNTER — Other Ambulatory Visit: Payer: Self-pay

## 2021-05-03 ENCOUNTER — Ambulatory Visit (HOSPITAL_COMMUNITY)
Admission: EM | Admit: 2021-05-03 | Discharge: 2021-05-03 | Disposition: A | Discharge status: Home / Self Care | Payer: BC Managed Care – PPO | Attending: Emergency Medicine | Admitting: Emergency Medicine

## 2021-05-03 ENCOUNTER — Encounter (HOSPITAL_COMMUNITY): Payer: Self-pay

## 2021-05-03 DIAGNOSIS — T148XXA Other injury of unspecified body region, initial encounter: Secondary | ICD-10-CM

## 2021-05-03 DIAGNOSIS — H5789 Other specified disorders of eye and adnexa: Secondary | ICD-10-CM

## 2021-05-03 MED ORDER — OLOPATADINE HCL 0.2 % OP SOLN: 1.0000 [drp] | OPHTHALMIC | 0 refills | Status: AC | PRN

## 2021-05-03 NOTE — ED Triage Notes (Signed)
Pt presents with bilateral eye redness with no complaints of pain, itchiness, or drainage since this morning; pt also complains of bruises with no pain or injury.

## 2021-05-03 NOTE — ED Provider Notes (Signed)
[facility]    CSN: [id number] Arrival date & time: 05/03/21  1915      History   Chief Complaint Chief Complaint  Patient presents with   Bruise   Eye Redness    HPI Christopher Barnes is a 65 y.o. male.   Patient here for bilateral eye redness that he first noticed today.  Denies any discharge, pain, or irritation.  Denies getting anything into his eyes or the sensation of a foreign body.  Denies any photophobia or vision changes.  Also reports several bruises without any known injuries.  Denies any pain.  Denies any trauma, injury, or other precipitating event.  Denies any specific alleviating or aggravating factors.  Denies any fevers, chest pain, shortness of breath, N/V/D, numbness, tingling, weakness, abdominal pain, or headaches.    The history is provided by the patient.   History reviewed. No pertinent past medical history.  There are no problems to display for this patient.   History reviewed. No pertinent surgical history.     Home Medications    Prior to Admission medications   Medication Sig Start Date End Date Taking? Authorizing Provider  Olopatadine HCl 0.2 % SOLN Apply 1 drop to eye as needed. 05/03/21  Yes Pearson Forster, NP  cyclobenzaprine (FLEXERIL) 10 MG tablet Take 1 tablet (10 mg total) by mouth 2 (two) times daily as needed for muscle spasms. 04/26/19   Tegeler, Gwenyth Allegra, MD  diclofenac (VOLTAREN) 75 MG EC tablet Take 1 tablet (75 mg total) by mouth 2 (two) times daily. Patient not taking: Reported on 04/26/2019 01/14/18   Wallene Huh, DPM  diltiazem [facility]) 240 MG 24 hr capsule Take 240 mg by mouth 2 (two) times daily.  12/01/17   [provider]  fosinopril-hydrochlorothiazide (MONOPRIL-HCT) 10-12.5 MG tablet Take 1 tablet by mouth daily.  12/01/17   [provider]  Potassium Chloride ER 20 MEQ TBCR Take 20 mEq by mouth 4 (four) times daily.     [provider]  simvastatin (ZOCOR) 20 MG tablet Take 20 mg  by mouth daily at 6 PM.  12/01/17   [provider]    Family History History reviewed. No pertinent family history.  Social History Social History   Tobacco Use   Smoking status: Never   Smokeless tobacco: Never     Allergies   Patient has no known allergies.   Review of Systems Review of Systems  Eyes:  Positive for redness. Negative for photophobia, pain, discharge, itching and visual disturbance.  Hematological:  Bruises/bleeds easily.  All other systems reviewed and are negative.   Physical Exam Triage Vital Signs ED Triage Vitals  Enc Vitals Group     BP 05/03/21 1955 (!) 153/77     Pulse Rate 05/03/21 1955 65     Resp 05/03/21 1955 17     Temp 05/03/21 1955 98.3 F (36.8 C)     Temp Source 05/03/21 1955 Oral     SpO2 05/03/21 1955 96 %     Weight --      Height --      Head Circumference --      Peak Flow --      Pain Score 05/03/21 1958 0     Pain Loc --      Pain Edu? --      Excl. in [place]? --    No data found.  Updated Vital Signs BP (!) 153/77 (BP Location: Left Arm)   Pulse  65   Temp 98.3 F (36.8 C) (Oral)   Resp 17   SpO2 96%   Visual Acuity Right Eye Distance:   Left Eye Distance:   Bilateral Distance:    Right Eye Near:   Left Eye Near:    Bilateral Near:     Physical Exam Vitals and nursing note reviewed.  Constitutional:      General: He is not in acute distress.    Appearance: Normal appearance. He is not ill-appearing, toxic-appearing or diaphoretic.  HENT:     Head: Normocephalic and atraumatic.  Eyes:     General: Lids are everted, no foreign bodies appreciated. Vision grossly intact. Gaze aligned appropriately.        Right eye: No foreign body, discharge or hordeolum.        Left eye: No foreign body, discharge or hordeolum.     Extraocular Movements: Extraocular movements intact.     Conjunctiva/sclera:     Right eye: Right conjunctiva is injected. No chemosis, exudate or hemorrhage.    Left eye: No exudate  or hemorrhage.    Pupils: Pupils are equal, round, and reactive to light.  Cardiovascular:     Rate and Rhythm: Normal rate.     Pulses: Normal pulses.  Pulmonary:     Effort: Pulmonary effort is normal.  Abdominal:     General: Abdomen is flat.  Musculoskeletal:        General: Normal range of motion.     Cervical back: Normal range of motion.  Skin:    General: Skin is warm and dry.     Findings: Bruising (lower abdominal bruising and beneath left arm) present.  Neurological:     General: No focal deficit present.     Mental Status: He is alert and oriented to person, place, and time.  Psychiatric:        Mood and Affect: Mood normal.     UC Treatments / Results  Labs (all labs ordered are listed, but only abnormal results are displayed) Labs Reviewed - No data to display  EKG   Radiology No results found.  Procedures Procedures (including critical care time)  Medications Ordered in UC Medications - No data to display  Initial Impression / Assessment and Plan / UC Course  I have reviewed the triage vital signs and the nursing notes.  Pertinent labs & imaging results that were available during my care of the patient were reviewed by me and considered in my medical decision making (see chart for details).    Assessment negative for red flags or concerns.  Eye redness.  May use Pataday eyedrops as needed for redness and irritation.  Recommend following up with eye doctor for reevaluation especially if redness is not improved.   Bruising.  Abdominal bruising likely related to weighted belt that patient wears during work.  Reports that he was just seen and evaluated by his primary care and lab work was completed at that time.  Recommend following up with primary care for further evaluation of bruising. Final Clinical Impressions(s) / UC Diagnoses   Final diagnoses:  Eye redness  Bruising     Discharge Instructions      You can use the olopatadine eye drops as  needed for eye redness and irritation.   Follow up with your eye doctor for re-evaluation.   Follow up with your primary care provider for further evaluation of bruising.      ED Prescriptions     Medication Sig Dispense  Auth. Provider   Olopatadine HCl 0.2 % SOLN Apply 1 drop to eye as needed. 2.5 mL Pearson Forster, NP      PDMP not reviewed this encounter.   Pearson Forster, NP 05/03/21 2119

## 2021-05-03 NOTE — Discharge Instructions (Signed)
You can use the olopatadine eye drops as needed for eye redness and irritation.   Follow up with your eye doctor for re-evaluation.   Follow up with your primary care provider for further evaluation of bruising.

## 2021-05-06 DIAGNOSIS — H1131 Conjunctival hemorrhage, right eye: Secondary | ICD-10-CM | POA: Diagnosis not present

## 2021-05-14 DIAGNOSIS — H35373 Puckering of macula, bilateral: Secondary | ICD-10-CM | POA: Diagnosis not present

## 2021-05-14 DIAGNOSIS — H2513 Age-related nuclear cataract, bilateral: Secondary | ICD-10-CM | POA: Diagnosis not present

## 2021-05-14 DIAGNOSIS — D352 Benign neoplasm of pituitary gland: Secondary | ICD-10-CM | POA: Diagnosis not present

## 2021-05-14 DIAGNOSIS — H5203 Hypermetropia, bilateral: Secondary | ICD-10-CM | POA: Diagnosis not present

## 2021-11-21 DIAGNOSIS — R7303 Prediabetes: Secondary | ICD-10-CM | POA: Diagnosis not present

## 2021-11-21 DIAGNOSIS — I10 Essential (primary) hypertension: Secondary | ICD-10-CM | POA: Diagnosis not present

## 2021-11-21 DIAGNOSIS — E7800 Pure hypercholesterolemia, unspecified: Secondary | ICD-10-CM | POA: Diagnosis not present

## 2022-05-05 DIAGNOSIS — I10 Essential (primary) hypertension: Secondary | ICD-10-CM | POA: Diagnosis not present

## 2022-05-05 DIAGNOSIS — Z0000 Encounter for general adult medical examination without abnormal findings: Secondary | ICD-10-CM | POA: Diagnosis not present

## 2022-05-05 DIAGNOSIS — H6122 Impacted cerumen, left ear: Secondary | ICD-10-CM | POA: Diagnosis not present

## 2022-05-05 DIAGNOSIS — Z5181 Encounter for therapeutic drug level monitoring: Secondary | ICD-10-CM | POA: Diagnosis not present

## 2022-05-05 DIAGNOSIS — R7303 Prediabetes: Secondary | ICD-10-CM | POA: Diagnosis not present

## 2022-05-05 DIAGNOSIS — Z23 Encounter for immunization: Secondary | ICD-10-CM | POA: Diagnosis not present

## 2022-05-05 DIAGNOSIS — E7800 Pure hypercholesterolemia, unspecified: Secondary | ICD-10-CM | POA: Diagnosis not present

## 2022-05-05 DIAGNOSIS — Z125 Encounter for screening for malignant neoplasm of prostate: Secondary | ICD-10-CM | POA: Diagnosis not present

## 2022-10-16 DIAGNOSIS — Z09 Encounter for follow-up examination after completed treatment for conditions other than malignant neoplasm: Secondary | ICD-10-CM | POA: Diagnosis not present

## 2022-10-16 DIAGNOSIS — Z86010 Personal history of colonic polyps: Secondary | ICD-10-CM | POA: Diagnosis not present

## 2022-11-03 DIAGNOSIS — R7303 Prediabetes: Secondary | ICD-10-CM | POA: Diagnosis not present

## 2022-11-03 DIAGNOSIS — I10 Essential (primary) hypertension: Secondary | ICD-10-CM | POA: Diagnosis not present

## 2022-11-03 DIAGNOSIS — Z23 Encounter for immunization: Secondary | ICD-10-CM | POA: Diagnosis not present

## 2022-11-03 DIAGNOSIS — E7800 Pure hypercholesterolemia, unspecified: Secondary | ICD-10-CM | POA: Diagnosis not present

## 2023-01-15 ENCOUNTER — Ambulatory Visit
Admission: RE | Admit: 2023-01-15 | Discharge: 2023-01-15 | Disposition: A | Discharge status: Home / Self Care | Payer: BC Managed Care – PPO | Source: Ambulatory Visit | Attending: Internal Medicine | Admitting: Internal Medicine

## 2023-01-15 ENCOUNTER — Other Ambulatory Visit: Payer: Self-pay | Admitting: Internal Medicine

## 2023-01-15 DIAGNOSIS — M549 Dorsalgia, unspecified: Secondary | ICD-10-CM

## 2023-01-15 DIAGNOSIS — R21 Rash and other nonspecific skin eruption: Secondary | ICD-10-CM | POA: Diagnosis not present

## 2023-01-15 DIAGNOSIS — R1030 Lower abdominal pain, unspecified: Secondary | ICD-10-CM | POA: Diagnosis not present

## 2023-01-15 DIAGNOSIS — M5136 Other intervertebral disc degeneration, lumbar region: Secondary | ICD-10-CM | POA: Diagnosis not present

## 2023-02-23 DIAGNOSIS — N400 Enlarged prostate without lower urinary tract symptoms: Secondary | ICD-10-CM | POA: Diagnosis not present

## 2023-02-23 DIAGNOSIS — M5136 Other intervertebral disc degeneration, lumbar region: Secondary | ICD-10-CM | POA: Diagnosis not present

## 2023-03-06 DIAGNOSIS — H52203 Unspecified astigmatism, bilateral: Secondary | ICD-10-CM | POA: Diagnosis not present

## 2023-03-06 DIAGNOSIS — H5203 Hypermetropia, bilateral: Secondary | ICD-10-CM | POA: Diagnosis not present

## 2023-03-06 DIAGNOSIS — H2513 Age-related nuclear cataract, bilateral: Secondary | ICD-10-CM | POA: Diagnosis not present

## 2023-05-07 DIAGNOSIS — Z5181 Encounter for therapeutic drug level monitoring: Secondary | ICD-10-CM | POA: Diagnosis not present

## 2023-05-07 DIAGNOSIS — Z0000 Encounter for general adult medical examination without abnormal findings: Secondary | ICD-10-CM | POA: Diagnosis not present

## 2023-05-07 DIAGNOSIS — I10 Essential (primary) hypertension: Secondary | ICD-10-CM | POA: Diagnosis not present

## 2023-05-07 DIAGNOSIS — R7303 Prediabetes: Secondary | ICD-10-CM | POA: Diagnosis not present

## 2023-05-07 DIAGNOSIS — E7800 Pure hypercholesterolemia, unspecified: Secondary | ICD-10-CM | POA: Diagnosis not present

## 2023-05-07 DIAGNOSIS — Z125 Encounter for screening for malignant neoplasm of prostate: Secondary | ICD-10-CM | POA: Diagnosis not present

## 2023-07-14 ENCOUNTER — Other Ambulatory Visit: Payer: Self-pay | Admitting: Family Medicine

## 2023-07-14 ENCOUNTER — Ambulatory Visit
Admission: RE | Admit: 2023-07-14 | Discharge: 2023-07-14 | Disposition: A | Discharge status: Home / Self Care | Payer: BC Managed Care – PPO | Source: Ambulatory Visit | Attending: Family Medicine | Admitting: Family Medicine

## 2023-07-14 DIAGNOSIS — M25511 Pain in right shoulder: Secondary | ICD-10-CM | POA: Diagnosis not present

## 2023-08-14 ENCOUNTER — Other Ambulatory Visit: Payer: Self-pay | Admitting: Orthopedic Surgery

## 2023-08-14 DIAGNOSIS — M7541 Impingement syndrome of right shoulder: Secondary | ICD-10-CM

## 2023-08-17 ENCOUNTER — Ambulatory Visit
Admission: RE | Admit: 2023-08-17 | Discharge: 2023-08-17 | Disposition: A | Discharge status: Home / Self Care | Payer: Worker's Compensation | Source: Ambulatory Visit | Attending: Orthopedic Surgery | Admitting: Orthopedic Surgery

## 2023-08-17 DIAGNOSIS — M7541 Impingement syndrome of right shoulder: Secondary | ICD-10-CM

## 2023-08-24 ENCOUNTER — Encounter: Payer: Self-pay | Admitting: Family Medicine

## 2023-10-17 ENCOUNTER — Emergency Department (HOSPITAL_COMMUNITY)
Admission: EM | Admit: 2023-10-17 | Discharge: 2023-10-17 | Disposition: A | Discharge status: Home / Self Care | Attending: Emergency Medicine | Admitting: Emergency Medicine

## 2023-10-17 ENCOUNTER — Encounter (HOSPITAL_COMMUNITY): Payer: Self-pay | Admitting: Emergency Medicine

## 2023-10-17 ENCOUNTER — Emergency Department (HOSPITAL_COMMUNITY)

## 2023-10-17 DIAGNOSIS — Z79899 Other long term (current) drug therapy: Secondary | ICD-10-CM | POA: Insufficient documentation

## 2023-10-17 DIAGNOSIS — K922 Gastrointestinal hemorrhage, unspecified: Secondary | ICD-10-CM | POA: Diagnosis not present

## 2023-10-17 DIAGNOSIS — K7689 Other specified diseases of liver: Secondary | ICD-10-CM | POA: Diagnosis not present

## 2023-10-17 DIAGNOSIS — K921 Melena: Secondary | ICD-10-CM | POA: Insufficient documentation

## 2023-10-17 DIAGNOSIS — I10 Essential (primary) hypertension: Secondary | ICD-10-CM | POA: Insufficient documentation

## 2023-10-17 DIAGNOSIS — N3289 Other specified disorders of bladder: Secondary | ICD-10-CM | POA: Diagnosis not present

## 2023-10-17 DIAGNOSIS — I700 Atherosclerosis of aorta: Secondary | ICD-10-CM | POA: Diagnosis not present

## 2023-10-17 LAB — CBC
HCT: 40.1 % (ref 39.0–52.0)
Hemoglobin: 13.3 g/dL (ref 13.0–17.0)
MCH: 28.5 pg (ref 26.0–34.0)
MCHC: 33.2 g/dL (ref 30.0–36.0)
MCV: 85.9 fL (ref 80.0–100.0)
Platelets: 300 10*3/uL (ref 150–400)
RBC: 4.67 MIL/uL (ref 4.22–5.81)
RDW: 13.2 % (ref 11.5–15.5)
WBC: 7.1 10*3/uL (ref 4.0–10.5)
nRBC: 0 % (ref 0.0–0.2)

## 2023-10-17 LAB — COMPREHENSIVE METABOLIC PANEL
ALT: 26 U/L (ref 0–44)
AST: 31 U/L (ref 15–41)
Albumin: 3.6 g/dL (ref 3.5–5.0)
Alkaline Phosphatase: 69 U/L (ref 38–126)
Anion gap: 10 (ref 5–15)
BUN: 15 mg/dL (ref 8–23)
CO2: 23 mmol/L (ref 22–32)
Calcium: 8.8 mg/dL — ABNORMAL LOW (ref 8.9–10.3)
Chloride: 109 mmol/L (ref 98–111)
Creatinine, Ser: 1.12 mg/dL (ref 0.61–1.24)
GFR, Estimated: >60 mL/min (ref >60)
Glucose, Bld: 123 mg/dL — ABNORMAL HIGH (ref 70–99)
Potassium: 3.4 mmol/L — ABNORMAL LOW (ref 3.5–5.1)
Sodium: 142 mmol/L (ref 135–145)
Total Bilirubin: 0.6 mg/dL (ref 0.0–1.2)
Total Protein: 6.6 g/dL (ref 6.5–8.1)

## 2023-10-17 LAB — CBC WITH DIFFERENTIAL/PLATELET
Abs Immature Granulocytes: 0.02 10*3/uL (ref 0.00–0.07)
Basophils Absolute: 0.1 10*3/uL (ref 0.0–0.1)
Basophils Relative: 1 %
Eosinophils Absolute: 0.1 10*3/uL (ref 0.0–0.5)
Eosinophils Relative: 2 %
HCT: 42.9 % (ref 39.0–52.0)
Hemoglobin: 14.1 g/dL (ref 13.0–17.0)
Immature Granulocytes: 0 %
Lymphocytes Relative: 30 %
Lymphs Abs: 2.1 10*3/uL (ref 0.7–4.0)
MCH: 28.3 pg (ref 26.0–34.0)
MCHC: 32.9 g/dL (ref 30.0–36.0)
MCV: 86.1 fL (ref 80.0–100.0)
Monocytes Absolute: 0.6 10*3/uL (ref 0.1–1.0)
Monocytes Relative: 8 %
Neutro Abs: 4.2 10*3/uL (ref 1.7–7.7)
Neutrophils Relative %: 59 %
Platelets: 305 10*3/uL (ref 150–400)
RBC: 4.98 MIL/uL (ref 4.22–5.81)
RDW: 13.1 % (ref 11.5–15.5)
WBC: 7.1 10*3/uL (ref 4.0–10.5)
nRBC: 0 % (ref 0.0–0.2)

## 2023-10-17 LAB — LIPASE, BLOOD: Lipase: 39 U/L (ref 11–51)

## 2023-10-17 LAB — POC OCCULT BLOOD, ED: Fecal Occult Bld: POSITIVE — AB

## 2023-10-17 MED ORDER — ESOMEPRAZOLE MAGNESIUM 40 MG PO CPDR: 40.0000 mg | DELAYED_RELEASE_CAPSULE | Freq: Every day | ORAL | 0 refills | Status: AC

## 2023-10-17 MED ORDER — PANTOPRAZOLE SODIUM 40 MG IV SOLR
40.0000 mg | Freq: Once | INTRAVENOUS | Status: AC
  Administered 2023-10-17: 40 mg via INTRAVENOUS
  Filled 2023-10-17: qty 10

## 2023-10-17 MED ORDER — SODIUM CHLORIDE 0.9 % IV BOLUS
1000.0000 mL | Freq: Once | INTRAVENOUS | Status: AC
  Administered 2023-10-17: 1000 mL via INTRAVENOUS

## 2023-10-17 MED ORDER — IOHEXOL 350 MG/ML SOLN
75.0000 mL | Freq: Once | INTRAVENOUS | Status: AC | PRN
  Administered 2023-10-17: 75 mL via INTRAVENOUS

## 2023-10-17 NOTE — Discharge Instructions (Addendum)
 As we discussed, your blood counts are normal today and your CT scan did not show any obvious bleeding  I will start you on Nexium 40 mg daily  Expect your stool for several days  You need to call [facility] office on Monday for appointment.   Return to ER if you have severe abdominal pain or blood in the stool

## 2023-10-17 NOTE — ED Triage Notes (Signed)
 Pt  here from home with c/o dark stool after taking some Pepto no n/v just some minor abd pain

## 2023-10-17 NOTE — ED Provider Notes (Signed)
 [place] EMERGENCY DEPARTMENT AT [facility] Provider Note   CSN: [id number] Arrival date & time: 10/17/23  1817     History  Chief Complaint  Patient presents with   Abdominal Pain    Christopher Barnes is a 68 y.o. male history of hypertension, rotator cuff impingement here presenting with abdominal pain and also melena.  Patient states that he went out to eat yesterday and after eating at a restaurant, he had some abdominal cramps.  He states that he took some Pepto-Bismol today.  He states that he was at another event this evening and had some abdominal cramps.  He then had a bowel movement that showed melanotic stool.  He states that he follows with Dr. Ewing Schlein and had a colonoscopy previously that was normal and did not show any polyps or cancer.  Patient is not on blood thinners and denies taking too much anti-inflammatories.  The history is provided by the patient.       Home Medications Prior to Admission medications   Medication Sig Start Date End Date Taking? Authorizing Provider  cyclobenzaprine (FLEXERIL) 10 MG tablet Take 1 tablet (10 mg total) by mouth 2 (two) times daily as needed for muscle spasms. 04/26/19   Tegeler, Canary Brim, MD  diclofenac (VOLTAREN) 75 MG EC tablet Take 1 tablet (75 mg total) by mouth 2 (two) times daily. Patient not taking: Reported on 04/26/2019 01/14/18   Lenn Sink, DPM  diltiazem [facility]) 240 MG 24 hr capsule Take 240 mg by mouth 2 (two) times daily.  12/01/17   [provider]  fosinopril-hydrochlorothiazide (MONOPRIL-HCT) 10-12.5 MG tablet Take 1 tablet by mouth daily.  12/01/17   [provider]  Olopatadine HCl 0.2 % SOLN Apply 1 drop to eye as needed. 05/03/21   Ivette Loyal, NP  Potassium Chloride ER 20 MEQ TBCR Take 20 mEq by mouth 4 (four) times daily.     [provider]  simvastatin (ZOCOR) 20 MG tablet Take 20 mg by mouth daily at 6 PM.  12/01/17   [provider]      Allergies     Patient has no known allergies.    Review of Systems   Review of Systems  Gastrointestinal:  Positive for abdominal pain.       Melena  All other systems reviewed and are negative.   Physical Exam Updated Vital Signs BP 135/67 (BP Location: Right Arm)   Pulse 60   Temp 97.7 F (36.5 C)   Resp 18   SpO2 99%  Physical Exam Vitals and nursing note reviewed.  Constitutional:      Appearance: He is well-developed.  HENT:     Head: Normocephalic.     Mouth/Throat:     Mouth: Mucous membranes are moist.  Eyes:     Extraocular Movements: Extraocular movements intact.     Pupils: Pupils are equal, round, and reactive to light.  Cardiovascular:     Rate and Rhythm: Normal rate and regular rhythm.  Pulmonary:     Effort: Pulmonary effort is normal.     Breath sounds: Normal breath sounds.  Abdominal:     General: Abdomen is flat.  Genitourinary:    Comments: Rectal- melanotic stool, no hemorrhoids  Skin:    General: Skin is warm.     Capillary Refill: Capillary refill takes less than 2 seconds.  Neurological:     General: No focal deficit present.     Mental Status: He is alert  and oriented to person, place, and time.  Psychiatric:        Mood and Affect: Mood normal.        Behavior: Behavior normal.     ED Results / Procedures / Treatments   Labs (all labs ordered are listed, but only abnormal results are displayed) Labs Reviewed  COMPREHENSIVE METABOLIC PANEL - Abnormal; Notable for the following components:      Result Value   Potassium 3.4 (*)    Glucose, Bld 123 (*)    Calcium 8.8 (*)    All other components within normal limits  POC OCCULT BLOOD, ED - Abnormal; Notable for the following components:   Fecal Occult Bld POSITIVE (*)    All other components within normal limits  LIPASE, BLOOD  CBC  CBC WITH DIFFERENTIAL/PLATELET    EKG None  Radiology No results found.  Procedures Procedures    Medications Ordered in ED Medications  sodium  chloride 0.9 % bolus 1,000 mL (has no administration in time range)    ED Course/ Medical Decision Making/ A&P                                 Medical Decision Making Christopher Barnes is a 68 y.o. male here presenting with melanotic stool.  Consider bleeding ulcer versus gastritis versus side effect of Pepto-Bismol.  Plan to get CBC and CMP and guaiac  9:21 PM Patient is guaiac faintly positive.  Hemoglobin is stable at 13.  Will get repeat CBC and CTA abdomen pelvis.   11:03 PM Repeat Hg is 14.  CT did not show any active bleeding.  Stable for discharge to follow-up with GI outpatient  Problems Addressed: Melena: acute illness or injury  Amount and/or Complexity of Data Reviewed Labs: ordered. Decision-making details documented in ED Course. Radiology: ordered and independent interpretation performed. Decision-making details documented in ED Course.  Risk Prescription drug management.    Final Clinical Impression(s) / ED Diagnoses Final diagnoses:  None    Rx / DC Orders ED Discharge Orders     None         Charlynne Pander, MD 10/17/23 2304

## 2023-10-19 DIAGNOSIS — I10 Essential (primary) hypertension: Secondary | ICD-10-CM | POA: Diagnosis not present

## 2023-10-19 DIAGNOSIS — E7800 Pure hypercholesterolemia, unspecified: Secondary | ICD-10-CM | POA: Diagnosis not present

## 2023-10-19 DIAGNOSIS — R7303 Prediabetes: Secondary | ICD-10-CM | POA: Diagnosis not present

## 2023-10-19 DIAGNOSIS — K921 Melena: Secondary | ICD-10-CM | POA: Diagnosis not present

## 2024-03-07 DIAGNOSIS — H2000 Unspecified acute and subacute iridocyclitis: Secondary | ICD-10-CM | POA: Diagnosis not present

## 2024-03-07 DIAGNOSIS — H2513 Age-related nuclear cataract, bilateral: Secondary | ICD-10-CM | POA: Diagnosis not present

## 2024-03-15 DIAGNOSIS — H2000 Unspecified acute and subacute iridocyclitis: Secondary | ICD-10-CM | POA: Diagnosis not present

## 2024-04-14 DIAGNOSIS — H2513 Age-related nuclear cataract, bilateral: Secondary | ICD-10-CM | POA: Diagnosis not present

## 2024-04-14 DIAGNOSIS — H2000 Unspecified acute and subacute iridocyclitis: Secondary | ICD-10-CM | POA: Diagnosis not present

## 2024-04-20 DIAGNOSIS — Z0000 Encounter for general adult medical examination without abnormal findings: Secondary | ICD-10-CM | POA: Diagnosis not present

## 2024-04-20 DIAGNOSIS — I10 Essential (primary) hypertension: Secondary | ICD-10-CM | POA: Diagnosis not present

## 2024-04-20 DIAGNOSIS — E7800 Pure hypercholesterolemia, unspecified: Secondary | ICD-10-CM | POA: Diagnosis not present

## 2024-04-20 DIAGNOSIS — R7303 Prediabetes: Secondary | ICD-10-CM | POA: Diagnosis not present

## 2024-04-28 DIAGNOSIS — Z23 Encounter for immunization: Secondary | ICD-10-CM | POA: Diagnosis not present

## 2024-08-02 DIAGNOSIS — M549 Dorsalgia, unspecified: Secondary | ICD-10-CM | POA: Diagnosis not present

## 2024-08-02 DIAGNOSIS — N400 Enlarged prostate without lower urinary tract symptoms: Secondary | ICD-10-CM | POA: Diagnosis not present
